# Patient Record
Sex: Male | Born: 1980 | Race: Black or African American | Hispanic: No | Marital: Married | Smoking: Never smoker
Health system: Southern US, Community
[De-identification: ages and names within clinical notes are randomized; demographics above are authoritative.]

## PROBLEM LIST (undated history)

## (undated) DIAGNOSIS — E079 Disorder of thyroid, unspecified: Secondary | ICD-10-CM

## (undated) DIAGNOSIS — I10 Essential (primary) hypertension: Secondary | ICD-10-CM

## (undated) DIAGNOSIS — A048 Other specified bacterial intestinal infections: Secondary | ICD-10-CM

---

## 2011-07-04 ENCOUNTER — Emergency Department: Payer: Self-pay | Admitting: Emergency Medicine

## 2013-04-16 ENCOUNTER — Emergency Department: Payer: Self-pay | Admitting: Emergency Medicine

## 2013-04-16 LAB — URINALYSIS, COMPLETE
Blood: NEGATIVE
Ketone: NEGATIVE
Leukocyte Esterase: NEGATIVE
Protein: NEGATIVE
Specific Gravity: 1.024 (ref 1.003–1.030)
WBC UR: <1 /HPF (ref 0–5)

## 2013-04-16 LAB — COMPREHENSIVE METABOLIC PANEL
Albumin: 4.2 g/dL (ref 3.4–5.0)
Alkaline Phosphatase: 69 U/L (ref 50–136)
BUN: 10 mg/dL (ref 7–18)
Calcium, Total: 8.9 mg/dL (ref 8.5–10.1)
Co2: 26 mmol/L (ref 21–32)
EGFR (African American): >60
EGFR (Non-African Amer.): >60
Glucose: 118 mg/dL — ABNORMAL HIGH (ref 65–99)
Potassium: 3.6 mmol/L (ref 3.5–5.1)
Total Protein: 8.1 g/dL (ref 6.4–8.2)

## 2013-04-16 LAB — CBC
HCT: 39.9 % — ABNORMAL LOW (ref 40.0–52.0)
HGB: 13.3 g/dL (ref 13.0–18.0)
MCH: 29.5 pg (ref 26.0–34.0)
MCHC: 33.3 g/dL (ref 32.0–36.0)
MCV: 89 fL (ref 80–100)
Platelet: 194 10*3/uL (ref 150–440)
RDW: 13.2 % (ref 11.5–14.5)

## 2013-04-16 LAB — LIPASE, BLOOD: Lipase: 86 U/L (ref 73–393)

## 2013-04-16 LAB — TROPONIN I: Troponin-I: <0.02 ng/mL

## 2013-04-17 LAB — TROPONIN I: Troponin-I: <0.02 ng/mL

## 2015-01-19 ENCOUNTER — Emergency Department (HOSPITAL_COMMUNITY)
Admission: EM | Admit: 2015-01-19 | Discharge: 2015-01-19 | Disposition: A | Discharge status: Home / Self Care | Payer: Medicaid Other | Attending: Emergency Medicine | Admitting: Emergency Medicine

## 2015-01-19 ENCOUNTER — Encounter (HOSPITAL_COMMUNITY): Payer: Self-pay

## 2015-01-19 ENCOUNTER — Emergency Department (HOSPITAL_COMMUNITY): Payer: Medicaid Other

## 2015-01-19 DIAGNOSIS — I10 Essential (primary) hypertension: Secondary | ICD-10-CM | POA: Diagnosis not present

## 2015-01-19 DIAGNOSIS — R0602 Shortness of breath: Secondary | ICD-10-CM | POA: Diagnosis not present

## 2015-01-19 DIAGNOSIS — R5383 Other fatigue: Secondary | ICD-10-CM | POA: Diagnosis present

## 2015-01-19 HISTORY — DX: Essential (primary) hypertension: I10

## 2015-01-19 LAB — BASIC METABOLIC PANEL
ANION GAP: 10 (ref 5–15)
BUN: 9 mg/dL (ref 6–20)
CHLORIDE: 103 mmol/L (ref 101–111)
CO2: 23 mmol/L (ref 22–32)
CREATININE: 1.07 mg/dL (ref 0.61–1.24)
Calcium: 9.1 mg/dL (ref 8.9–10.3)
GFR calc Af Amer: >60 mL/min (ref >60)
GFR calc non Af Amer: >60 mL/min (ref >60)
GLUCOSE: 102 mg/dL — AB (ref 65–99)
Potassium: 3.8 mmol/L (ref 3.5–5.1)
Sodium: 136 mmol/L (ref 135–145)

## 2015-01-19 LAB — CBC
HCT: 40.4 % (ref 39.0–52.0)
HEMOGLOBIN: 13.5 g/dL (ref 13.0–17.0)
MCH: 29.2 pg (ref 26.0–34.0)
MCHC: 33.4 g/dL (ref 30.0–36.0)
MCV: 87.3 fL (ref 78.0–100.0)
Platelets: 187 10*3/uL (ref 150–400)
RBC: 4.63 MIL/uL (ref 4.22–5.81)
RDW: 12.9 % (ref 11.5–15.5)
WBC: 5.4 10*3/uL (ref 4.0–10.5)

## 2015-01-19 LAB — I-STAT TROPONIN, ED: Troponin i, poc: 0 ng/mL (ref 0.00–0.08)

## 2015-01-19 NOTE — ED Provider Notes (Signed)
CSN: [id number]     Arrival date & time 01/19/15  7425 History   First MD Initiated Contact with Patient 01/19/15 [phone]     Chief Complaint  Patient presents with  . Shortness of Breath  . Fatigue     (Consider location/radiation/quality/duration/timing/severity/associated sxs/prior Treatment) HPI  Pt presenting with c/o feeling fatigued, he states he is concerned about his blood pressure.  He has been working in the garden this week and has not been drinking a lot of water.  No changes in vision or speech, no focal weakness, no chest pain.  Feels some mild sob at times.  No leg swelling.  He states over the pat 4 months he has been taking a medication for his blood pressure- when asked what this is his wife states it is not western medicine.  He also states that he does not take this regularly.  He moved to [facility] from [place] 4 months ago- states he had a PMD there and was on a med for blood pressure but does no have PMD here.  There are no other associated systemic symptoms, there are no other alleviating or modifying factors.   Past Medical History  Diagnosis Date  . Hypertension    History reviewed. No pertinent past surgical history. No family history on file. History  Substance Use Topics  . Smoking status: Never Smoker   . Smokeless tobacco: Not on file  . Alcohol Use: Yes     Comment: occas    Review of Systems  ROS reviewed and all otherwise negative except for mentioned in HPI    Allergies  Review of patient's allergies indicates no known allergies.  Home Medications   Prior to Admission medications   Not on File   BP 144/90 mmHg  Pulse 55  Temp(Src) 98 F (36.7 C) (Oral)  Resp 20  Ht  (1.803 m)  Wt 204 lb (92.534 kg)  BMI 28.46 kg/m2  SpO2 98%  Vitals reviewed Physical Exam  Physical Examination: General appearance - alert, well appearing, and in no distress Mental status - alert, oriented to person, place, and time Eyes - no conjunctival injection, no  scleral icterus Mouth - mucous membranes moist, pharynx normal without lesions Chest - clear to auscultation, no wheezes, rales or rhonchi, symmetric air entry Heart - normal rate, regular rhythm, normal S1, S2, no murmurs, rubs, clicks or gallops Abdomen - soft, nontender, nondistended, no masses or organomegaly Neurological - alert, oriented x 3 Extremities - peripheral pulses normal, no pedal edema, no clubbing or cyanosis Skin - normal coloration and turgor, no rashes  ED Course  Procedures (including critical care time) Labs Review Labs Reviewed  BASIC METABOLIC PANEL - Abnormal; Notable for the following:    Glucose, Bld 102 (*)    All other components within normal limits  CBC  I-STAT TROPOININ, ED    Imaging Review Dg Chest 2 View  01/19/2015   CLINICAL DATA:  Shortness of breath and hypertension.  EXAM: CHEST - 2 VIEW  COMPARISON:  04/16/2013  FINDINGS: The heart size and mediastinal contours are within normal limits. There is no evidence of pulmonary edema, consolidation, pneumothorax, nodule or pleural fluid. The visualized skeletal structures are unremarkable.  IMPRESSION: No active disease.   Electronically Signed   By: Irish Lack M.D.   On: 01/19/2015 08:32     EKG Interpretation   Date/Time:  Thursday January 19 2015 08:00:06 EDT Ventricular Rate:  83 PR Interval:  159 QRS Duration: 99  QT Interval:  379 QTC Calculation: 445 R Axis:   78 Text Interpretation:  Sinus rhythm RSR' in V1 or V2, probably normal  variant Baseline wander in lead(s) I III aVL No significant change since  last tracing Confirmed by [facility]  MD, Rhylen Shaheen [phone]) on 01/19/2015 8:53:43  AM      MDM   Final diagnoses:  Essential hypertension  Other fatigue    Pt presenting with c/o fatigue and concern about his blood pressure  No signs of stroke, no end organ damaage found on workup.  D/w patient the importance of following up/establishing care with a PMD.  Discharged with strict return  precautions.  Pt agreeable with plan.    Jerelyn ScottMartha Linker, MD 01/20/15 [phone]

## 2015-01-19 NOTE — ED Notes (Signed)
Pt called his wife yesterday and told him that he didn't feel good. Hasn't been checking his BP but has been having headaches. Has been outside a lot in the garden lately not drinking water. Felt SOB yesterday and weak all over and the same today. C/o left neck pain after wife had to remind him.

## 2015-01-19 NOTE — Discharge Instructions (Signed)
Return to the ED with any concerns including chest pain, difficulty breathing, fainting, leg swelling, decreased lelve of alertness/lethargy, or any other alarming symptoms

## 2015-05-25 ENCOUNTER — Encounter (HOSPITAL_COMMUNITY): Payer: Self-pay

## 2015-05-25 ENCOUNTER — Emergency Department (HOSPITAL_COMMUNITY): Payer: Medicaid Other

## 2015-05-25 ENCOUNTER — Emergency Department (HOSPITAL_COMMUNITY)
Admission: EM | Admit: 2015-05-25 | Discharge: 2015-05-25 | Disposition: A | Discharge status: Home / Self Care | Payer: Medicaid Other | Attending: Emergency Medicine | Admitting: Emergency Medicine

## 2015-05-25 DIAGNOSIS — R42 Dizziness and giddiness: Secondary | ICD-10-CM | POA: Insufficient documentation

## 2015-05-25 DIAGNOSIS — I10 Essential (primary) hypertension: Secondary | ICD-10-CM | POA: Insufficient documentation

## 2015-05-25 DIAGNOSIS — R51 Headache: Secondary | ICD-10-CM | POA: Diagnosis not present

## 2015-05-25 DIAGNOSIS — Z79899 Other long term (current) drug therapy: Secondary | ICD-10-CM | POA: Diagnosis not present

## 2015-05-25 DIAGNOSIS — R079 Chest pain, unspecified: Secondary | ICD-10-CM | POA: Insufficient documentation

## 2015-05-25 DIAGNOSIS — Z8619 Personal history of other infectious and parasitic diseases: Secondary | ICD-10-CM | POA: Insufficient documentation

## 2015-05-25 LAB — I-STAT CHEM 8, ED
BUN: 15 mg/dL (ref 6–20)
CALCIUM ION: 1.22 mmol/L (ref 1.12–1.23)
CHLORIDE: 101 mmol/L (ref 101–111)
CREATININE: 1.1 mg/dL (ref 0.61–1.24)
Glucose, Bld: 105 mg/dL — ABNORMAL HIGH (ref 65–99)
HCT: 46 % (ref 39.0–52.0)
Hemoglobin: 15.6 g/dL (ref 13.0–17.0)
Potassium: 4 mmol/L (ref 3.5–5.1)
Sodium: 140 mmol/L (ref 135–145)
TCO2: 27 mmol/L (ref 0–100)

## 2015-05-25 LAB — I-STAT TROPONIN, ED: TROPONIN I, POC: 0 ng/mL (ref 0.00–0.08)

## 2015-05-25 MED ORDER — IBUPROFEN 400 MG PO TABS
600.0000 mg | ORAL_TABLET | Freq: Once | ORAL | Status: AC
  Administered 2015-05-25: 600 mg via ORAL
  Filled 2015-05-25 (×2): qty 1

## 2015-05-25 NOTE — ED Provider Notes (Signed)
CSN: [id number]     Arrival date & time 05/25/15  0803 History   First MD Initiated Contact with Patient 05/25/15 [phone]     Chief Complaint  Patient presents with  . Chest Pain    The history is provided by the patient and medical records.     34 yo M with hx HTN, H pylori presenting with chest pain, HA.   Chest pain- onset 3 days ago. Waxing and waning. Located in left chest. Mild severity. Described as pressure. Worse with feeling bloated. No alleviating factors identified. Similar presentation in the past and pt says he was dx with H pylori at that time. He picked up the meds but was afraid of the side effects so he didn't take them. Associated sx include lightheadedness. Denies diaphoresis, SOB, cough, fevers, leg swelling, nausea/emesis.  HA- onset about a week ago, gradually. Located in occipital area. Described as pressure. Waxing and waning. Mild to moderate severity. Non radiating. No associated neuro deficits. Similar to prior HA. Improved with ibuprofen.    Past Medical History  Diagnosis Date  . Hypertension    History reviewed. No pertinent past surgical history. No family history on file. Social History  Substance Use Topics  . Smoking status: Never Smoker   . Smokeless tobacco: None  . Alcohol Use: Yes     Comment: occas    Review of Systems  Constitutional: Negative for fever.  HENT: Negative for rhinorrhea.   Eyes: Negative for visual disturbance.  Respiratory: Negative for shortness of breath.   Cardiovascular: Positive for chest pain.  Gastrointestinal: Negative for vomiting and abdominal pain.  Genitourinary: Negative for decreased urine volume.  Skin: Negative for rash.  Allergic/Immunologic: Negative for immunocompromised state.  Neurological: Positive for light-headedness and headaches. Negative for syncope.  Psychiatric/Behavioral: Negative for confusion.    Allergies  Review of patient's allergies indicates no known allergies.  Home Medications    Prior to Admission medications   Medication Sig Start Date End Date Taking? Authorizing Provider  Ascorbic Acid (VITAMIN C) 1000 MG tablet Take 2,000 mg by mouth daily.   Yes Historical Provider, MD  Flaxseed, Linseed, (FLAXSEED OIL) 1000 MG CAPS Take 2,000 mg by mouth daily.   Yes Historical Provider, MD   BP 154/87 mmHg  Pulse 65  Temp(Src) 98.1 F (36.7 C) (Oral)  Resp 20  Ht  (1.803 m)  Wt 205 lb (92.987 kg)  BMI 28.60 kg/m2  SpO2 100% Physical Exam  Constitutional: He is oriented to person, place, and time. He appears well-developed and well-nourished. No distress.  HENT:  Head: Normocephalic and atraumatic.  Eyes: Right eye exhibits no discharge. Left eye exhibits no discharge.  Neck: Normal range of motion. Neck supple. No tracheal deviation present.  Cardiovascular: Normal rate and regular rhythm.   Pulmonary/Chest: Effort normal and breath sounds normal. No respiratory distress.  Abdominal: Soft. He exhibits no distension. There is no tenderness.  Musculoskeletal: He exhibits no edema.  Neurological: He is alert and oriented to person, place, and time. He has normal strength. No cranial nerve deficit or sensory deficit. Coordination and gait normal.  Skin: Skin is warm and dry.  Psychiatric: He has a normal mood and affect. His behavior is normal.    ED Course  Procedures (including critical care time) Labs Review Labs Reviewed  I-STAT CHEM 8, ED - Abnormal; Notable for the following:    Glucose, Bld 105 (*)    All other components within normal limits  I-STAT TROPOININ,  ED    Imaging Review Dg Chest 2 View  05/25/2015   CLINICAL DATA:  Began feeling weak and dizzy 3 days ago at home while cooking, chest discomfort on LEFT into LEFT shoulder, intermittent posterior headache for last week, history hypertension  EXAM: CHEST  2 VIEW  COMPARISON:  01/19/2015  FINDINGS: Normal heart size, mediastinal contours, and pulmonary vascularity.  Lungs clear.  No  pneumothorax.  Bones unremarkable.  IMPRESSION: Normal exam.   Electronically Signed   By: Ulyses Southward M.D.   On: 05/25/2015 09:28   I have personally reviewed and evaluated these images and lab results as part of my medical decision-making.   EKG Interpretation   Date/Time:  Thursday May 25 2015 08:14:19 EDT Ventricular Rate:  75 PR Interval:  148 QRS Duration: 98 QT Interval:  366 QTC Calculation: 409 R Axis:   82 Text Interpretation:  Sinus rhythm ST elev, probable normal early repol  pattern No significant change since last tracing Confirmed by [facility]  MD,  MARTHA [phone]) on 05/25/2015 8:18:02 AM      MDM   Final diagnoses:  Chest pain, unspecified chest pain type  Essential hypertension    34 yo M with hx HTN, H pylori (both currently untreated) presenting with CP, HA, lightheadedness. AF, VSS, neurologically intact. HTN ranging SBP 150s-184 likely 2/2 untreated HTN, no evidence of HTN emergency. No acute EKG changes and trop neg after 3 days CP, doubt ACS/ischemia. No PNA or PTX. Doubt PE, low risk per [facility]. Likely CP related to gastritis / untreated H pylori as pt describes sx worsening when he feels bloated and states pain is identical to what he felt when he got dx with the h pylori. Counseled pt on importance of treating hypertension as well as H. Pylori and risks of noncompliance. Patient verbalized understanding and will follow-up with his primary care doctor. Agreed to take H. pylori triple therapy, which he already has at home.  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 Neuro intact. No recent history of trauma, patient not on anticoagulation. Treated with Motrin with improvement.  Discharged in stable condition with return precautions, follow-up as an outpatient.   Case discussed with Dr. Karma Ganja who oversaw management of this patient.    Urban Gibson, MD 05/25/15 1002  Jerelyn Scott, MD 05/25/15 1009

## 2015-05-25 NOTE — ED Notes (Signed)
Per Pt, Patient was at home three days ago cooking when he started to feel weak and dizzy. Patient reports starting to have a chest discomfort. Patient reports the chest discomfort never went away. Patient states he still doesn't feel well and he knew he needed to be seen. Patient reports an intermittent posterior headache for the last week.

## 2015-05-25 NOTE — ED Notes (Signed)
Patient returned from XRay

## 2015-05-25 NOTE — ED Notes (Signed)
Resident at the bedside

## 2016-11-28 IMAGING — DX DG CHEST 2V
2 series · 2 of 2 positions shown · non-contrast
Comparison: 01/19/2015

CLINICAL DATA: Began feeling weak and dizzy 3 days ago at home
while cooking, chest discomfort on LEFT into LEFT shoulder,
intermittent posterior headache for last week, history hypertension

EXAM:
CHEST  2 VIEW

[w chest pa]
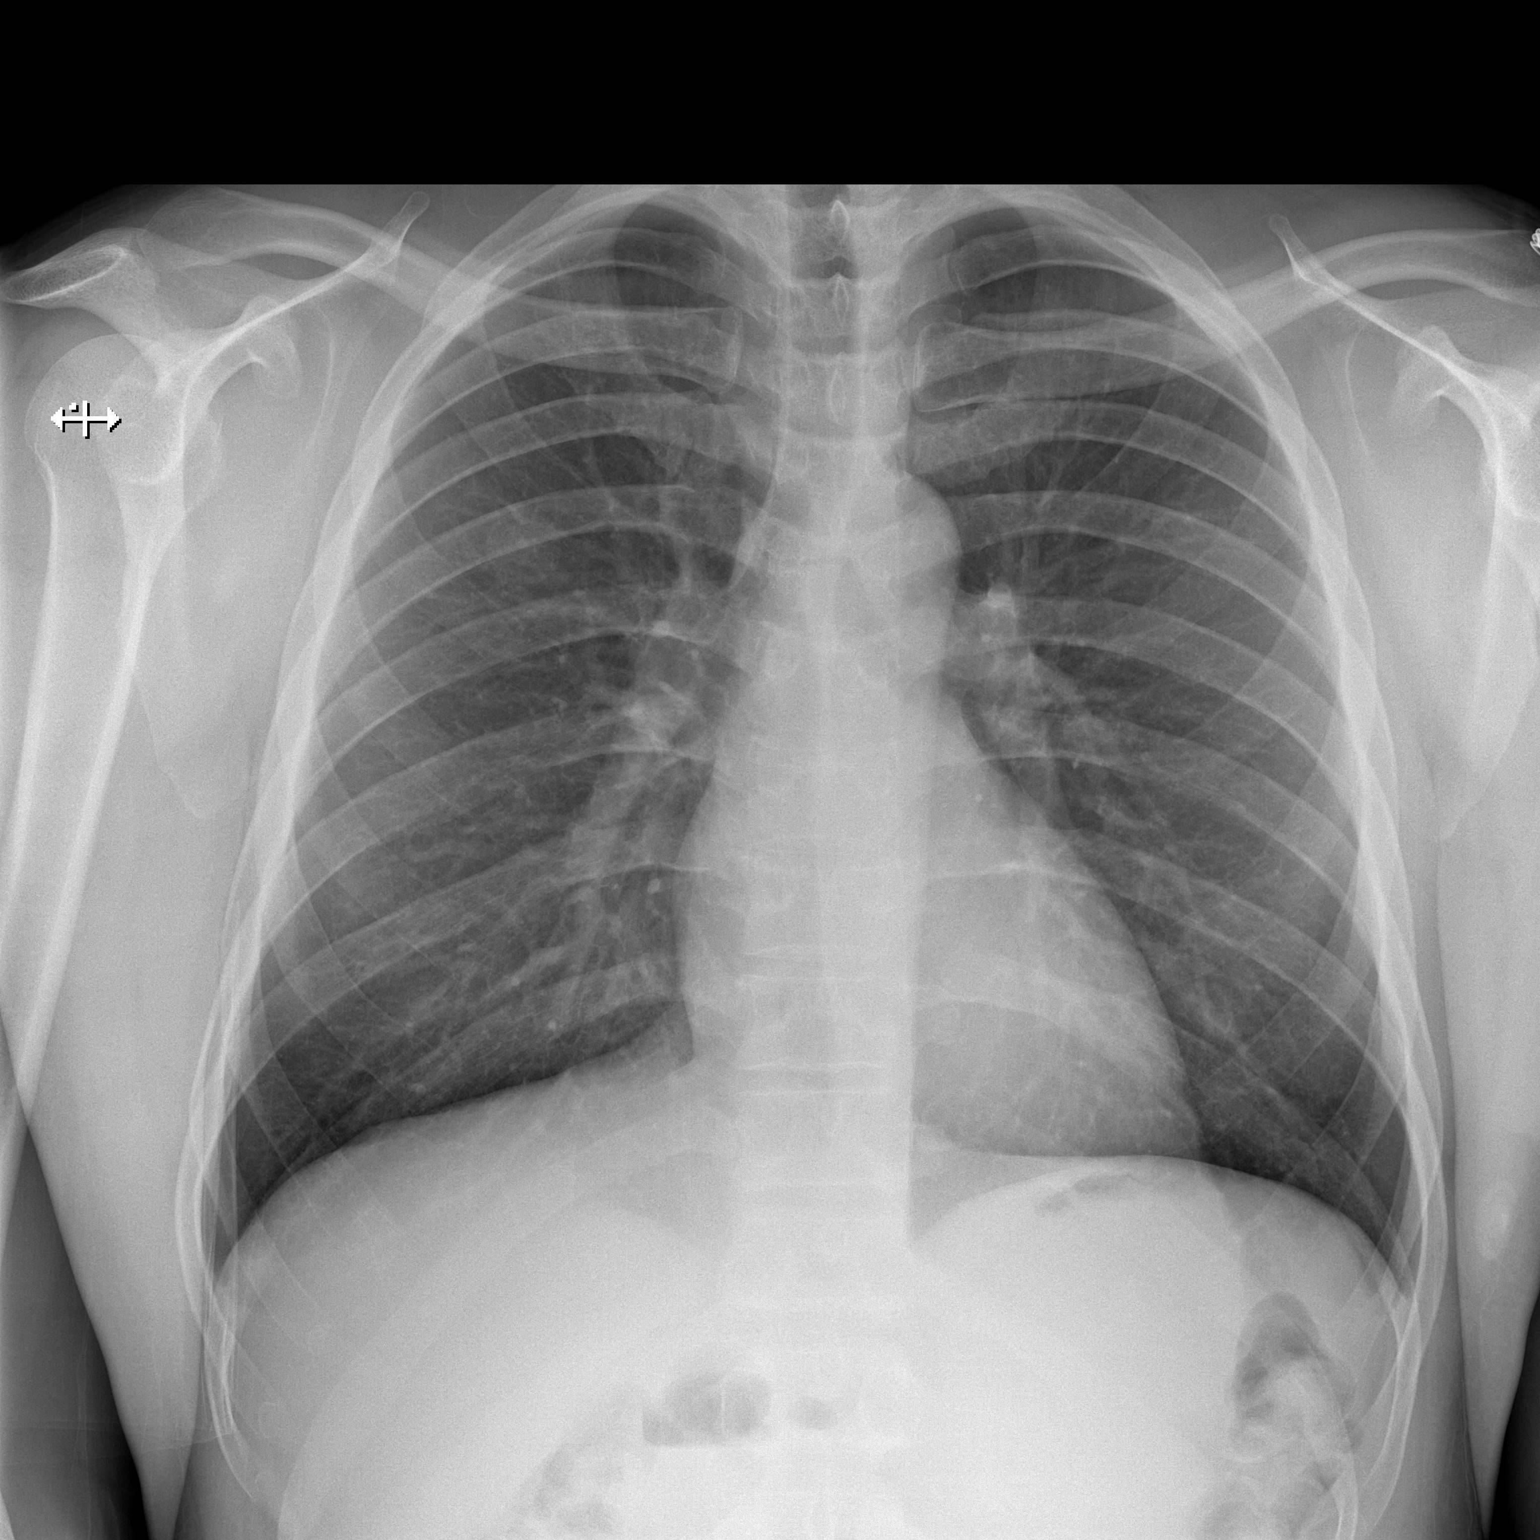

[w chest lat]
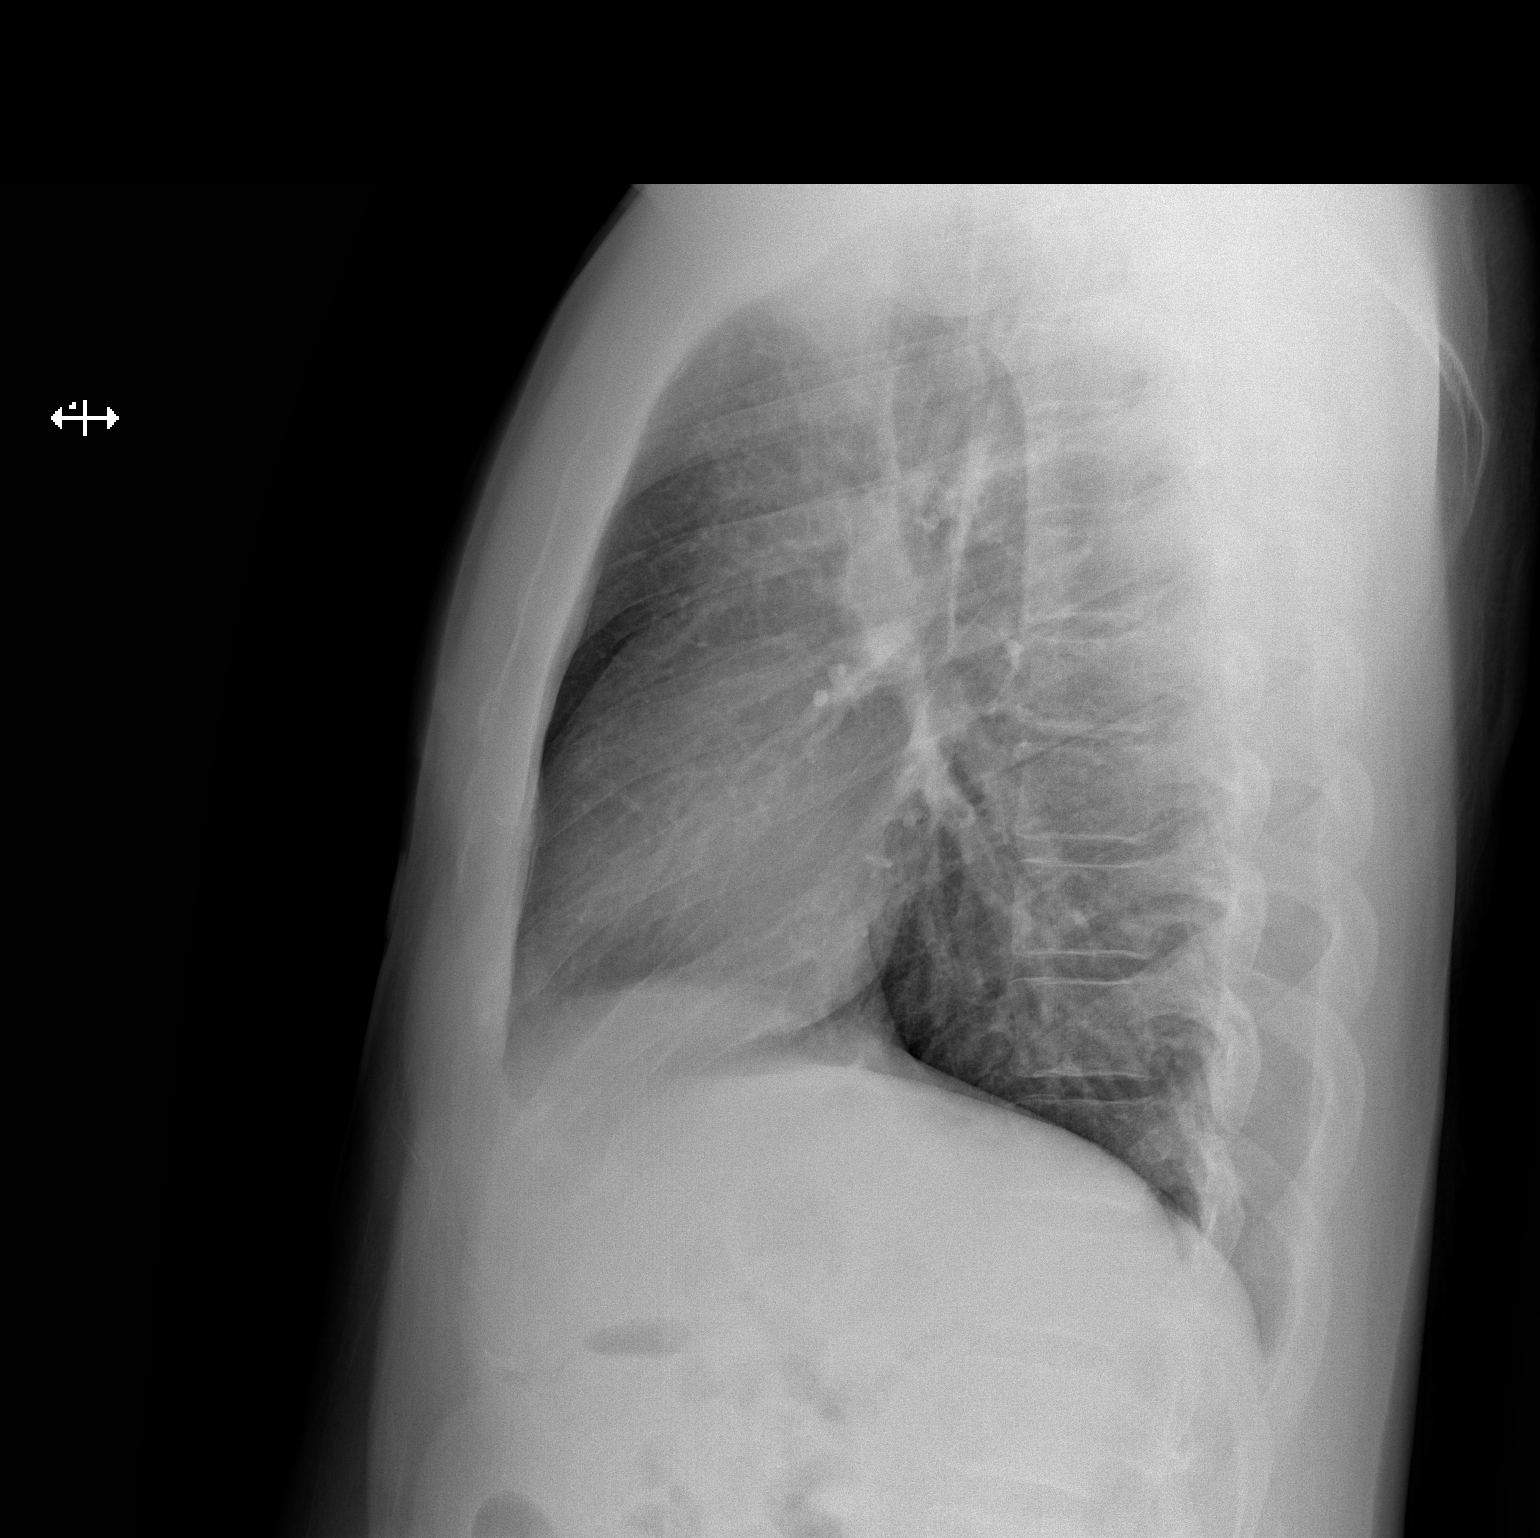

[2 of 2 positions shown; findings below may reference images not displayed]

FINDINGS: Normal heart size, mediastinal contours, and pulmonary vascularity.

Lungs clear.

No pneumothorax.

Bones unremarkable.
IMPRESSION: Normal exam.

## 2017-05-21 ENCOUNTER — Emergency Department
Admission: EM | Admit: 2017-05-21 | Discharge: 2017-05-21 | Disposition: A | Discharge status: Home / Self Care | Payer: Medicaid Other | Attending: Student in an Organized Health Care Education/Training Program | Admitting: Student in an Organized Health Care Education/Training Program

## 2017-05-21 ENCOUNTER — Emergency Department: Payer: Medicaid Other

## 2017-05-21 ENCOUNTER — Encounter: Payer: Self-pay | Admitting: *Deleted

## 2017-05-21 DIAGNOSIS — I10 Essential (primary) hypertension: Secondary | ICD-10-CM | POA: Insufficient documentation

## 2017-05-21 DIAGNOSIS — Z79899 Other long term (current) drug therapy: Secondary | ICD-10-CM | POA: Insufficient documentation

## 2017-05-21 DIAGNOSIS — R000 Tachycardia, unspecified: Secondary | ICD-10-CM | POA: Diagnosis present

## 2017-05-21 DIAGNOSIS — R002 Palpitations: Secondary | ICD-10-CM | POA: Insufficient documentation

## 2017-05-21 DIAGNOSIS — E039 Hypothyroidism, unspecified: Secondary | ICD-10-CM | POA: Diagnosis not present

## 2017-05-21 DIAGNOSIS — R450 Nervousness: Secondary | ICD-10-CM | POA: Diagnosis not present

## 2017-05-21 LAB — TROPONIN I

## 2017-05-21 LAB — CBC
HCT: 36.9 % — ABNORMAL LOW (ref 40.0–52.0)
HEMOGLOBIN: 12.5 g/dL — AB (ref 13.0–18.0)
MCH: 29.1 pg (ref 26.0–34.0)
MCHC: 34 g/dL (ref 32.0–36.0)
MCV: 85.6 fL (ref 80.0–100.0)
PLATELETS: 198 10*3/uL (ref 150–440)
RBC: 4.3 MIL/uL — ABNORMAL LOW (ref 4.40–5.90)
RDW: 13.6 % (ref 11.5–14.5)
WBC: 7.5 10*3/uL (ref 3.8–10.6)

## 2017-05-21 LAB — BASIC METABOLIC PANEL
Anion gap: 11 (ref 5–15)
BUN: 12 mg/dL (ref 6–20)
CALCIUM: 9 mg/dL (ref 8.9–10.3)
CHLORIDE: 105 mmol/L (ref 101–111)
CO2: 22 mmol/L (ref 22–32)
CREATININE: 1.15 mg/dL (ref 0.61–1.24)
GFR calc Af Amer: >60 mL/min (ref >60)
Glucose, Bld: 154 mg/dL — ABNORMAL HIGH (ref 65–99)
POTASSIUM: 3.3 mmol/L — AB (ref 3.5–5.1)
Sodium: 138 mmol/L (ref 135–145)

## 2017-05-21 LAB — TSH: TSH: 5.801 u[IU]/mL — AB (ref 0.350–4.500)

## 2017-05-21 LAB — T4, FREE: Free T4: 0.74 ng/dL (ref 0.61–1.12)

## 2017-05-21 NOTE — Discharge Instructions (Addendum)
Ref Range & Units 01:05  Free T4 0.61 - 1.12 ng/dL 1.61   Comment: (NOTE)  Biotin ingestion may interfere with free T4 tests. If the results are  inconsistent with the TSH level, previous test results, or the  clinical presentation, then consider biotin interference. If needed,  order repeat testing after stopping biotin.      Ref Range & Units 01:05  TSH 0.350 - 4.500 uIU/mL 5.801    Comment: Performed by a 3rd Generation assay with a functional sensitivity of <=0.01 uIU/mL.

## 2017-05-21 NOTE — ED Provider Notes (Signed)
[facility] Emergency Department Provider Note    First MD Initiated Contact with Patient 05/21/17 0304     (approximate)  I have reviewed the triage vital signs and the nursing notes.   HISTORY  Chief Complaint Tachycardia    HPI Gerald Marshall is a 36 y.o. male prazosin to complaint of jitteriness, abnormal feeling, hot and cold chills and palpitations that occurred after he was cooking dinner tonight. Denied any chest pain or shortness of breath. No numbness or tingling. Patient recently started new herbal medication after seen acupuncturists this week. Patient currently being evaluated for possible hypo-tyroid disease. Not currently on any thyroid medications. Has remote history of smoking. Denies any alcohol use. No history of anxiety or depression. Denies any pain when taking a deep breath. No nausea vomiting or diarrhea.   Past Medical History:  Diagnosis Date  . Hypertension    No family history on file. No past surgical history on file. There are no active problems to display for this patient.     Prior to Admission medications   Medication Sig Start Date End Date Taking? Authorizing Provider  Ascorbic Acid (VITAMIN C) 1000 MG tablet Take 2,000 mg by mouth daily.    [provider]  Flaxseed, Linseed, (FLAXSEED OIL) 1000 MG CAPS Take 2,000 mg by mouth daily.    [provider]    Allergies Patient has no known allergies.    Social History Social History  Substance Use Topics  . Smoking status: Never Smoker  . Smokeless tobacco: Never Used  . Alcohol use Yes     Comment: occas    Review of Systems Patient denies headaches, rhinorrhea, blurry vision, numbness, shortness of breath, chest pain, edema, cough, abdominal pain, nausea, vomiting, diarrhea, dysuria, fevers, rashes or hallucinations unless otherwise stated above in HPI. ____________________________________________   PHYSICAL EXAM:  VITAL  SIGNS: Vitals:   05/21/17 0330 05/21/17 0400  BP: (!) 141/78 (!) 119/51  Pulse: 70 (!) 51  Resp:    Temp:    SpO2: 97% 99%    Constitutional: Alert and oriented. Well appearing and in no acute distress. Eyes: Conjunctivae are normal.  Head: Atraumatic. Nose: No congestion/rhinnorhea. Mouth/Throat: Mucous membranes are moist.   Neck: No stridor. Painless ROM. No thyromegaly Cardiovascular: Normal rate, regular rhythm. Grossly normal heart sounds.  Good peripheral circulation. Respiratory: Normal respiratory effort.  No retractions. Lungs CTAB. Gastrointestinal: Soft and nontender. No distention. No abdominal bruits. No CVA tenderness. Genitourinary:  Musculoskeletal: No lower extremity tenderness nor edema.  No joint effusions. Neurologic:  Normal speech and language. No gross focal neurologic deficits are appreciated. No facial droop Skin:  Skin is warm, dry and intact. No rash noted. Psychiatric: Mood and affect are normal. Speech and behavior are normal.  ____________________________________________   LABS (all labs ordered are listed, but only abnormal results are displayed)  Results for orders placed or performed during the hospital encounter of 05/21/17 (from the past 24 hour(s))  Basic metabolic panel     Status: Abnormal   Collection Time: 05/21/17  1:05 AM  Result Value Ref Range   Sodium 138 135 - 145 mmol/L   Potassium 3.3 (L) 3.5 - 5.1 mmol/L   Chloride 105 101 - 111 mmol/L   CO2 22 22 - 32 mmol/L   Glucose, Bld 154 (H) 65 - 99 mg/dL   BUN 12 6 - 20 mg/dL   Creatinine, Ser 1.61 0.61 - 1.24 mg/dL   Calcium 9.0 8.9 - 10.3  mg/dL   GFR calc non Af Amer >60 >60 mL/min   GFR calc Af Amer >60 >60 mL/min   Anion gap 11 5 - 15  CBC     Status: Abnormal   Collection Time: 05/21/17  1:05 AM  Result Value Ref Range   WBC 7.5 3.8 - 10.6 K/uL   RBC 4.30 (L) 4.40 - 5.90 MIL/uL   Hemoglobin 12.5 (L) 13.0 - 18.0 g/dL   HCT 16.1 (L) 09.6 - 04.5 %   MCV 85.6 80.0 - 100.0  fL   MCH 29.1 26.0 - 34.0 pg   MCHC 34.0 32.0 - 36.0 g/dL   RDW 40.9 81.1 - 91.4 %   Platelets 198 150 - 440 K/uL  Troponin I     Status: None   Collection Time: 05/21/17  1:05 AM  Result Value Ref Range   Troponin I <0.03 <0.03 ng/mL  TSH     Status: Abnormal   Collection Time: 05/21/17  1:05 AM  Result Value Ref Range   TSH 5.801 (H) 0.350 - 4.500 uIU/mL  T4, free     Status: None   Collection Time: 05/21/17  1:05 AM  Result Value Ref Range   Free T4 0.74 0.61 - 1.12 ng/dL   ____________________________________________  EKG My review and personal interpretation at Time: 1:05   Indication: tachycardia  Rate: 100  Rhythm: sinus Axis: normal  Other: normal intervals, no brugada or wpw,  ____________________________________________  RADIOLOGY  I personally reviewed all radiographic images ordered to evaluate for the above acute complaints and reviewed radiology reports and findings.  These findings were personally discussed with the patient.  Please see medical record for radiology report.  ____________________________________________   PROCEDURES  Procedure(s) performed:  Procedures    Critical Care performed: no ____________________________________________   INITIAL IMPRESSION / ASSESSMENT AND PLAN / ED COURSE  Pertinent labs & imaging results that were available during my care of the patient were reviewed by me and considered in my medical decision making (see chart for details).  DDX: thyrotoxicosis, dehydration, electrolyte abn, dysrhythmia, anxiety  Gerald Marshall is a 36 y.o. who presents to the ED with symptoms as described above. Currently feels well and symptoms have resolved. No evidence of dysrhythmia or preexcitation syndrome. Blood work is reassuring. She does have mild hyperthyroid but T4 is normal. Given the sudden resolution of symptoms is not clinically consistent with thyrotoxicosis or myxedema coma. This pointed with patient is stable and  appropriate for further workup as an outpatient.  Have discussed with the patient and available family all diagnostics and treatments performed thus far and all questions were answered to the best of my ability. The patient demonstrates understanding and agreement with plan.       ____________________________________________   FINAL CLINICAL IMPRESSION(S) / ED DIAGNOSES  Final diagnoses:  Palpitations  Jittery feeling  Hypothyroidism, unspecified type      NEW MEDICATIONS STARTED DURING THIS VISIT:  New Prescriptions   No medications on file     Note:  This document was prepared using Dragon voice recognition software and may include unintentional dictation errors.    Willy Eddy, MD 05/21/17 [phone]

## 2017-05-21 NOTE — ED Notes (Signed)
Lab results reviewed. Awaiting room for MD eval.  

## 2017-05-21 NOTE — ED Notes (Signed)
Pt states last night he was cooking dinner when he felt like his heart was racing. He became hot and got the chills. Stated he "didnt feel right". Family at bedside.

## 2017-05-21 NOTE — ED Triage Notes (Signed)
Pt reports while getting ready for bed tonight his heart started racing and he became dizzy.  No chest pain or sob.  Pt alert   Speech clear.

## 2018-04-16 ENCOUNTER — Emergency Department
Admission: EM | Admit: 2018-04-16 | Discharge: 2018-04-16 | Disposition: A | Discharge status: Home / Self Care | Payer: Medicaid Other | Attending: Emergency Medicine | Admitting: Emergency Medicine

## 2018-04-16 DIAGNOSIS — I10 Essential (primary) hypertension: Secondary | ICD-10-CM | POA: Diagnosis not present

## 2018-04-16 DIAGNOSIS — Z79899 Other long term (current) drug therapy: Secondary | ICD-10-CM | POA: Insufficient documentation

## 2018-04-16 DIAGNOSIS — R1013 Epigastric pain: Secondary | ICD-10-CM | POA: Diagnosis present

## 2018-04-16 HISTORY — DX: Other specified bacterial intestinal infections: A04.8

## 2018-04-16 HISTORY — DX: Disorder of thyroid, unspecified: E07.9

## 2018-04-16 LAB — COMPREHENSIVE METABOLIC PANEL
ALBUMIN: 4.3 g/dL (ref 3.5–5.0)
ALK PHOS: 53 U/L (ref 38–126)
ALT: 61 U/L — AB (ref 0–44)
AST: 68 U/L — AB (ref 15–41)
Anion gap: 6 (ref 5–15)
BILIRUBIN TOTAL: 0.6 mg/dL (ref 0.3–1.2)
BUN: 13 mg/dL (ref 6–20)
CALCIUM: 8.9 mg/dL (ref 8.9–10.3)
CO2: 24 mmol/L (ref 22–32)
Chloride: 106 mmol/L (ref 98–111)
Creatinine, Ser: 0.98 mg/dL (ref 0.61–1.24)
GFR calc Af Amer: >60 mL/min (ref >60)
GFR calc non Af Amer: >60 mL/min (ref >60)
GLUCOSE: 128 mg/dL — AB (ref 70–99)
Potassium: 3.5 mmol/L (ref 3.5–5.1)
Sodium: 136 mmol/L (ref 135–145)
Total Protein: 7.6 g/dL (ref 6.5–8.1)

## 2018-04-16 LAB — CBC
HCT: 37.8 % — ABNORMAL LOW (ref 40.0–52.0)
Hemoglobin: 12.9 g/dL — ABNORMAL LOW (ref 13.0–18.0)
MCH: 29.3 pg (ref 26.0–34.0)
MCHC: 34.2 g/dL (ref 32.0–36.0)
MCV: 85.7 fL (ref 80.0–100.0)
Platelets: 190 10*3/uL (ref 150–440)
RBC: 4.41 MIL/uL (ref 4.40–5.90)
RDW: 13.4 % (ref 11.5–14.5)
WBC: 8.1 10*3/uL (ref 3.8–10.6)

## 2018-04-16 LAB — LIPASE, BLOOD: Lipase: 25 U/L (ref 11–51)

## 2018-04-16 NOTE — ED Notes (Signed)
Pt verabilized understanding of discharge paperwork and follow up. Esignature not working.

## 2018-04-16 NOTE — ED Provider Notes (Signed)
[facility] Emergency Department Provider Note  Time seen: 9:58 PM  I have reviewed the triage vital signs and the nursing notes.   HISTORY  Chief Complaint Abdominal Pain    HPI Gerald Marshall is a 37 y.o. male with a past medical history of H. pylori infection several years ago, hypertension, presents to the emergency department for abdominal pain.  According to the patient several hours ago while at work he developed sudden acute severe upper abdominal pain.  States he was very sharp 10 out of 10 pain located between his bellybutton in epigastric area.  Denies any nausea vomiting or diarrhea.  States it felt like gas like he needed to belch but he could not.  He states after 20 minutes the pain completely resolved and he has been pain-free ever since.  Denies any pain into the chest or trouble breathing.  Largely negative review of systems.  Past Medical History:  Diagnosis Date  . H. pylori infection   . Hypertension   . Thyroid disease     There are no active problems to display for this patient.   History reviewed. No pertinent surgical history.  Prior to Admission medications   Medication Sig Start Date End Date Taking? Authorizing Provider  Ascorbic Acid (VITAMIN C) 1000 MG tablet Take 2,000 mg by mouth daily.    [provider]  Flaxseed, Linseed, (FLAXSEED OIL) 1000 MG CAPS Take 2,000 mg by mouth daily.    [provider]    No Known Allergies  No family history on file.  Social History Social History   Tobacco Use  . Smoking status: Never Smoker  . Smokeless tobacco: Never Used  Substance Use Topics  . Alcohol use: Yes    Comment: occas  . Drug use: No    Review of Systems Constitutional: Negative for fever. Cardiovascular: Negative for chest pain. Respiratory: Negative for shortness of breath. Gastrointestinal: Positive for epigastric pain now resolved.  Is negative for nausea vomiting or  diarrhea Genitourinary: Negative for urinary compaints Musculoskeletal: Negative for musculoskeletal complaints Skin: Negative for skin complaints  Neurological: Negative for headache All other ROS negative  ____________________________________________   PHYSICAL EXAM:  VITAL SIGNS: ED Triage Vitals  Enc Vitals Group     BP 04/16/18 2023 137/87     Pulse Rate 04/16/18 2023 80     Resp 04/16/18 2023 17     Temp 04/16/18 2023 98.4 F (36.9 C)     Temp Source 04/16/18 2023 Oral     SpO2 04/16/18 2023 98 %     Weight 04/16/18 2024 235 lb (106.6 kg)     Height 04/16/18 2024 5\' 11"  (1.803 m)     Head Circumference --      Peak Flow --      Pain Score 04/16/18 2023 5     Pain Loc --      Pain Edu? --      Excl. in GC? --     Constitutional: Alert and oriented. Well appearing and in no distress. Eyes: Normal exam ENT   Head: Normocephalic and atraumatic.   Mouth/Throat: Mucous membranes are moist. Cardiovascular: Normal rate, regular rhythm. No murmur Respiratory: Normal respiratory effort without tachypnea nor retractions. Breath sounds are clear  Gastrointestinal: Soft and nontender. No distention.   Musculoskeletal: Nontender with normal range of motion in all extremities Neurologic:  Normal speech and language. No gross focal neurologic deficits  Skin:  Skin is warm, dry and intact.  Psychiatric: Mood and affect are normal. Speech and behavior are normal.   ____________________________________________    EKG  EKG reviewed and interpreted by myself shows normal sinus rhythm at 77 bpm with a narrow QRS, normal axis, normal intervals, no concerning ST changes.  ____________________________________________   INITIAL IMPRESSION / ASSESSMENT AND PLAN / ED COURSE  Pertinent labs & imaging results that were available during my care of the patient were reviewed by me and considered in my medical decision making (see chart for details).  Patient presents to the  emergency department for sudden onset of sharp and fairly significant periumbilical and upper abdomen/epigastric discomfort.  Lasted approximately 20 minutes total and then completely resolved per patient.  Denies any symptoms whatsoever at this time is a completely benign abdominal exam.  Denies any chest pain or shortness of breath at any point.  Denies any nausea vomiting diaphoresis.  Patient appears very well.  His labs are reassuring including a normal white blood cell count negative lipase.  Patient does have very very slight elevation of his AST and ALT he has a completely benign exam however no right upper quadrant tenderness.  I reviewed the patient's labs his prior LFTs are not significantly different from today's LFT values.  As the patient is pain-free at this time we will discharge home with PCP follow-up.  I did discuss with the patient if his symptoms are to return I would encourage him to come back to the emergency department for repeat evaluation.  ____________________________________________   FINAL CLINICAL IMPRESSION(S) / ED DIAGNOSES  Epigastric pain    Minna Antis, MD 04/16/18 2202

## 2018-04-16 NOTE — Discharge Instructions (Addendum)
As we discussed your work-up today was overall normal.  However if your symptoms do recur please return to the emergency department for further evaluation.  Otherwise please follow-up with your primary care doctor in the next 1 week for recheck/reevaluation.

## 2018-04-16 NOTE — ED Triage Notes (Signed)
Patient c/o epigastric pain X today, approx 45 minutes ago. Patient denies accompanying symptoms. Patient reports hx of H. Pylori.

## 2018-04-16 NOTE — ED Notes (Signed)
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 Pt states that he has a hx of H. Pylori, but the pain was worse than usual.

## 2018-11-25 IMAGING — CR DG CHEST 2V
1 series · 2 of 2 positions shown · non-contrast
Comparison: 05/25/2015

CLINICAL DATA: Dizziness and heart racing. History of hypertension.
Smoker.

EXAM:
CHEST  2 VIEW

[Series 1: dg chest 2 view · 0.14mm/px · 2 of 2 slices shown]
[im 1/2]
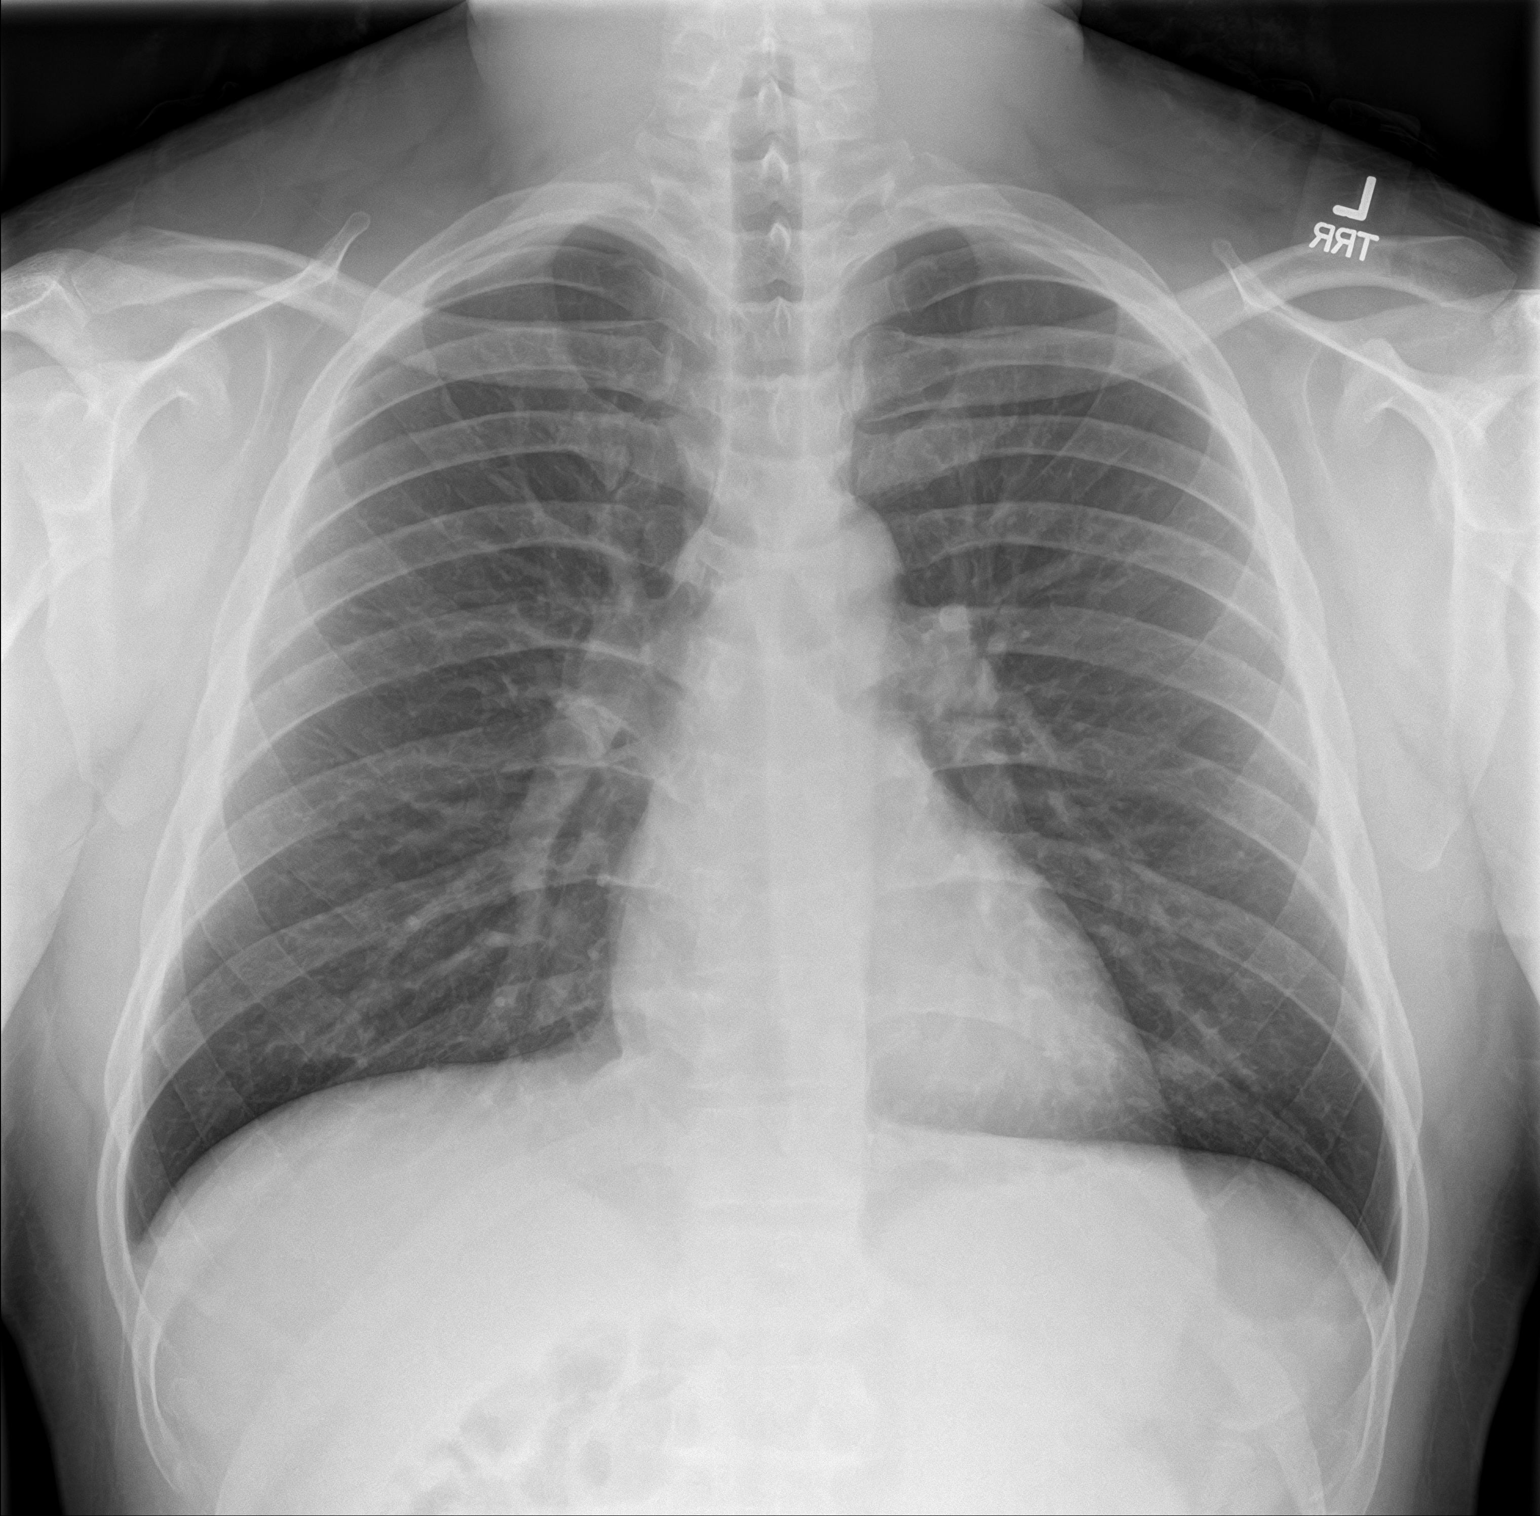
[im 2/2]
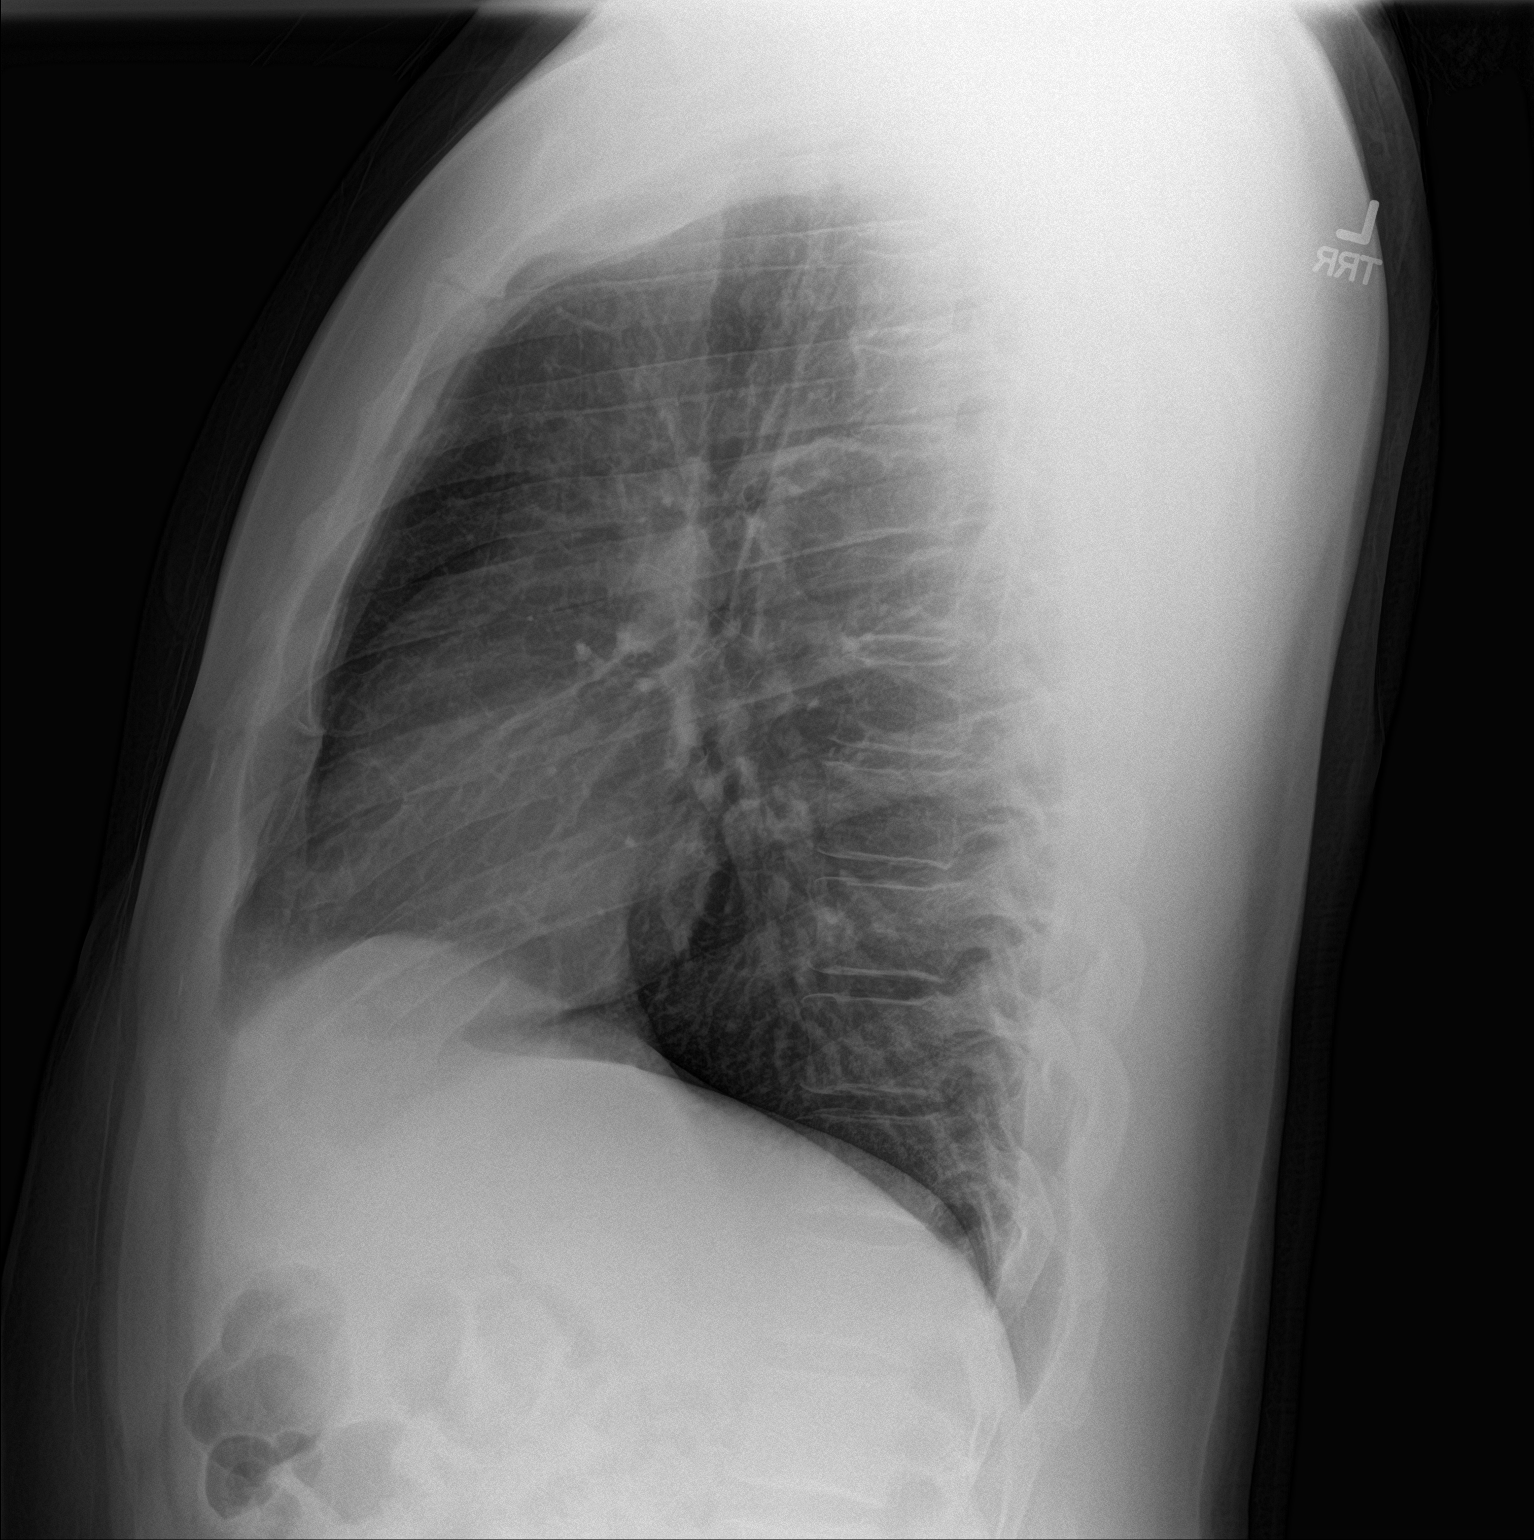

[2 of 2 positions shown; findings below may reference images not displayed]

FINDINGS: The heart size and mediastinal contours are within normal limits.
Both lungs are clear. The visualized skeletal structures are
unremarkable.
IMPRESSION: No active cardiopulmonary disease.

## 2023-11-03 ENCOUNTER — Ambulatory Visit (HOSPITAL_BASED_OUTPATIENT_CLINIC_OR_DEPARTMENT_OTHER): Payer: Commercial Managed Care - HMO | Admitting: Family Medicine
